# Patient Record
Sex: Male | Born: 1995 | Race: Black or African American | Hispanic: No | Marital: Single | Smoking: Former smoker
Health system: Southern US, Community
[De-identification: ages and names within clinical notes are randomized; demographics above are authoritative.]

## PROBLEM LIST (undated history)

## (undated) ENCOUNTER — Emergency Department (HOSPITAL_COMMUNITY): Admission: EM | Payer: Self-pay

---
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
## 2021-10-21 ENCOUNTER — Emergency Department (HOSPITAL_COMMUNITY)
Admission: EM | Admit: 2021-10-21 | Discharge: 2021-10-21 | Discharge status: Against Medical Advice | Payer: Self-pay | Source: Home / Self Care

## 2022-09-21 ENCOUNTER — Emergency Department (HOSPITAL_COMMUNITY)
Admission: EM | Admit: 2022-09-21 | Discharge: 2022-09-21 | Discharge status: Against Medical Advice | Payer: Medicaid Other

## 2022-09-21 NOTE — ED Notes (Signed)
Pt never answered for triage called 3x

## 2022-09-21 NOTE — ED Notes (Signed)
No answer in the lobby x 3 . 

## 2023-05-06 ENCOUNTER — Emergency Department (HOSPITAL_COMMUNITY)
Admission: EM | Admit: 2023-05-06 | Discharge: 2023-05-06 | Discharge status: Against Medical Advice | Payer: Medicaid Other

## 2023-05-06 NOTE — ED Notes (Signed)
I called Pt name 7 times but no response Pt left AMA

## 2024-01-24 ENCOUNTER — Other Ambulatory Visit: Payer: Self-pay

## 2024-01-24 ENCOUNTER — Encounter (HOSPITAL_COMMUNITY): Payer: Self-pay | Admitting: Pharmacy Technician

## 2024-01-24 ENCOUNTER — Emergency Department (HOSPITAL_COMMUNITY)
Admission: EM | Admit: 2024-01-24 | Discharge: 2024-01-24 | Disposition: A | Discharge status: Home / Self Care | Payer: Self-pay | Attending: Emergency Medicine | Admitting: Emergency Medicine

## 2024-01-24 DIAGNOSIS — M542 Cervicalgia: Secondary | ICD-10-CM | POA: Insufficient documentation

## 2024-01-24 MED ORDER — KETOROLAC TROMETHAMINE 15 MG/ML IJ SOLN
15.0000 mg | Freq: Once | INTRAMUSCULAR | Status: AC
  Administered 2024-01-24: 15 mg via INTRAMUSCULAR
  Filled 2024-01-24: qty 1

## 2024-01-24 MED ORDER — IBUPROFEN 800 MG PO TABS: 800.0000 mg | ORAL_TABLET | Freq: Three times a day (TID) | ORAL | 0 refills | Status: AC

## 2024-01-24 MED ORDER — METHOCARBAMOL 500 MG PO TABS: 500.0000 mg | ORAL_TABLET | Freq: Every evening | ORAL | 0 refills | Status: AC

## 2024-01-24 NOTE — ED Triage Notes (Signed)
 Pt has neck and shoulder pain that feels like a pinched nerve. Says it has been ongoing for the past couple months, this morning worse and had to leave work.

## 2024-01-24 NOTE — Discharge Instructions (Addendum)
 Suspect you pulled a muscle in your neck.  Please take ibuprofen up to 3 times a day as needed for 7 days.  You can also use the muscle relaxer, take at night do not drink or drive on the medication as it can make you sleepy.  As discussed, I really recommend that you follow-up with your primary care doctor and possibly get a referral to physical therapy.  In the meantime, I provided some exercises for you to work on to help with your pain.  You can also use topical medicine such as IcyHot or lidocaine patches which can be found at any local pharmacy.  Please return to the emergency department if you develop sudden numbness in your extremities, weakness, or any other new or concerning symptoms.

## 2024-01-24 NOTE — ED Provider Notes (Signed)
 [place] EMERGENCY DEPARTMENT AT [facility] Provider Note   CSN: [id number] Arrival date & time: 01/24/24  9083     Patient presents with: Shoulder Pain and Back Pain   Benjamin Barrera is a 28 y.o. male.   HPI 28 year old male presenting with complaints of right sided neck pain.  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  He started to have worsening spasms today and had to leave work.  He denies any falls or neck injuries.  He denies any numbness or tingling down his extremities, no weakness.  He has not taken anything for his pain.  He denies any fever or neck stiffness.    Prior to Admission medications   Medication Sig Start Date End Date Taking? Authorizing Provider  ibuprofen (ADVIL) 800 MG tablet Take 1 tablet (800 mg total) by mouth 3 (three) times daily. 01/24/24  Yes Vonn Hadassah LABOR, PA-C  methocarbamol  (ROBAXIN ) 500 MG tablet Take 1 tablet (500 mg total) by mouth at bedtime. 01/24/24  Yes Vonn Hadassah LABOR, PA-C  meloxicam  (MOBIC ) 15 MG tablet Take 1 tablet (15 mg total) by mouth daily. 04/28/12   Harris, Abigail, PA-C    Allergies: Patient has no known allergies.    Review of Systems Ten systems reviewed and are negative for acute change, except as noted in the HPI.   Updated Vital Signs BP 124/86   Pulse 65   Temp 98.2 F (36.8 C) (Oral)   Resp 14   SpO2 100%   Physical Exam Vitals and nursing note reviewed.  Constitutional:      General: He is not in acute distress.    Appearance: He is well-developed.  HENT:     Head: Normocephalic and atraumatic.  Eyes:     Conjunctiva/sclera: Conjunctivae normal.  Cardiovascular:     Rate and Rhythm: Normal rate and regular rhythm.     Heart sounds: No murmur heard. Pulmonary:     Effort: Pulmonary effort is normal. No respiratory distress.     Breath sounds: Normal breath sounds.  Abdominal:     Palpations: Abdomen is soft.     Tenderness: There is no abdominal tenderness.   Musculoskeletal:        General: Tenderness present. No swelling.     Cervical back: Neck supple.     Comments: Mild right sided paraspinal muscle tenderness as well as trapezius tenderness.  5/5 strength in upper lower extremities bilaterally, sensations intact bilaterally  Skin:    General: Skin is warm and dry.     Capillary Refill: Capillary refill takes less than 2 seconds.  Neurological:     Mental Status: He is alert.  Psychiatric:        Mood and Affect: Mood normal.     (all labs ordered are listed, but only abnormal results are displayed) Labs Reviewed - No data to display  EKG: None  Radiology: No results found.   Procedures   Medications Ordered in the ED  ketorolac (TORADOL) 15 MG/ML injection 15 mg (has no administration in time range)                                    Medical Decision Making Risk Prescription drug management.   28 year old male presenting with complaints of neck pain.  He has no red flag signs including no numbness, no tingling, strength is intact, no meningeal signs,  no fever, no symptoms concerning for meningitis.  Suspect muscular strain.  Will give a shot of Toradol here, encouraged 7-day course of ibuprofen and will prescribe Robaxin , patient educated on sedating side effects.  I did heavily stressed that he follow-up with his primary care doctor and possibly get a physical therapy referral as I do think he would benefit from this.  We discussed return precautions, and he voiced understanding and is agreeable.  Stable for discharge.     Final diagnoses:  Neck pain    ED Discharge Orders          Ordered    methocarbamol  (ROBAXIN ) 500 MG tablet  Nightly        01/24/24 0935    ibuprofen (ADVIL) 800 MG tablet  3 times daily        01/24/24 0935               Vonn Hadassah LABOR, PA-C 01/24/24 9061    Freddi Hamilton, MD 01/27/24 [phone]
